# Patient Record
Sex: Male | Born: 1975 | Race: White | Marital: Married | Smoking: Never smoker
Health system: Southern US, Community
[De-identification: ages and names within clinical notes are randomized; demographics above are authoritative.]

## PROBLEM LIST (undated history)

## (undated) DIAGNOSIS — G4730 Sleep apnea, unspecified: Secondary | ICD-10-CM

## (undated) DIAGNOSIS — F329 Major depressive disorder, single episode, unspecified: Secondary | ICD-10-CM

## (undated) DIAGNOSIS — F32A Depression, unspecified: Secondary | ICD-10-CM

## (undated) HISTORY — PX: NASAL SEPTUM SURGERY: SHX37

---

## 2016-02-10 ENCOUNTER — Ambulatory Visit: Payer: Self-pay | Admitting: Physician Assistant

## 2016-09-09 ENCOUNTER — Encounter: Payer: Self-pay | Admitting: Emergency Medicine

## 2016-09-09 ENCOUNTER — Emergency Department: Payer: BLUE CROSS/BLUE SHIELD

## 2016-09-09 ENCOUNTER — Emergency Department
Admission: EM | Admit: 2016-09-09 | Discharge: 2016-09-09 | Disposition: A | Discharge status: Home / Self Care | Payer: BLUE CROSS/BLUE SHIELD | Attending: Student in an Organized Health Care Education/Training Program | Admitting: Student in an Organized Health Care Education/Training Program

## 2016-09-09 DIAGNOSIS — X58XXXA Exposure to other specified factors, initial encounter: Secondary | ICD-10-CM | POA: Insufficient documentation

## 2016-09-09 DIAGNOSIS — T18120A Food in esophagus causing compression of trachea, initial encounter: Secondary | ICD-10-CM | POA: Insufficient documentation

## 2016-09-09 DIAGNOSIS — J069 Acute upper respiratory infection, unspecified: Secondary | ICD-10-CM | POA: Insufficient documentation

## 2016-09-09 DIAGNOSIS — Y929 Unspecified place or not applicable: Secondary | ICD-10-CM | POA: Insufficient documentation

## 2016-09-09 DIAGNOSIS — R0989 Other specified symptoms and signs involving the circulatory and respiratory systems: Secondary | ICD-10-CM

## 2016-09-09 DIAGNOSIS — Y939 Activity, unspecified: Secondary | ICD-10-CM | POA: Insufficient documentation

## 2016-09-09 DIAGNOSIS — Y999 Unspecified external cause status: Secondary | ICD-10-CM | POA: Insufficient documentation

## 2016-09-09 HISTORY — DX: Major depressive disorder, single episode, unspecified: F32.9

## 2016-09-09 HISTORY — DX: Sleep apnea, unspecified: G47.30

## 2016-09-09 HISTORY — DX: Depression, unspecified: F32.A

## 2016-09-09 LAB — COMPREHENSIVE METABOLIC PANEL
ALT: 68 U/L — ABNORMAL HIGH (ref 17–63)
AST: 43 U/L — ABNORMAL HIGH (ref 15–41)
Albumin: 4.9 g/dL (ref 3.5–5.0)
Alkaline Phosphatase: 41 U/L (ref 38–126)
Anion gap: 8 (ref 5–15)
BILIRUBIN TOTAL: 0.5 mg/dL (ref 0.3–1.2)
BUN: 13 mg/dL (ref 6–20)
CO2: 27 mmol/L (ref 22–32)
Calcium: 9.4 mg/dL (ref 8.9–10.3)
Chloride: 102 mmol/L (ref 101–111)
Creatinine, Ser: 0.95 mg/dL (ref 0.61–1.24)
Glucose, Bld: 115 mg/dL — ABNORMAL HIGH (ref 65–99)
POTASSIUM: 4.3 mmol/L (ref 3.5–5.1)
Sodium: 137 mmol/L (ref 135–145)
TOTAL PROTEIN: 8.4 g/dL — AB (ref 6.5–8.1)

## 2016-09-09 LAB — CBC WITH DIFFERENTIAL/PLATELET
BASOS ABS: 0.1 10*3/uL (ref 0–0.1)
Basophils Relative: 1 %
EOS ABS: 0.1 10*3/uL (ref 0–0.7)
Eosinophils Relative: 1 %
HCT: 44.1 % (ref 40.0–52.0)
HEMOGLOBIN: 15.3 g/dL (ref 13.0–18.0)
LYMPHS ABS: 1.3 10*3/uL (ref 1.0–3.6)
LYMPHS PCT: 15 %
MCH: 31.4 pg (ref 26.0–34.0)
MCHC: 34.8 g/dL (ref 32.0–36.0)
MCV: 90.3 fL (ref 80.0–100.0)
Monocytes Absolute: 0.7 10*3/uL (ref 0.2–1.0)
Monocytes Relative: 8 %
NEUTROS PCT: 75 %
Neutro Abs: 6.9 10*3/uL — ABNORMAL HIGH (ref 1.4–6.5)
PLATELETS: 212 10*3/uL (ref 150–440)
RBC: 4.88 MIL/uL (ref 4.40–5.90)
RDW: 13 % (ref 11.5–14.5)
WBC: 9.1 10*3/uL (ref 3.8–10.6)

## 2016-09-09 MED ORDER — CETIRIZINE HCL 10 MG PO TABS: 10.0000 mg | ORAL_TABLET | Freq: Every day | ORAL | 0 refills | Status: AC

## 2016-09-09 MED ORDER — IOPAMIDOL (ISOVUE-300) INJECTION 61%
75.0000 mL | Freq: Once | INTRAVENOUS | Status: AC | PRN
  Administered 2016-09-09: 75 mL via INTRAVENOUS
  Filled 2016-09-09: qty 75

## 2016-09-09 MED ORDER — MAGIC MOUTHWASH W/LIDOCAINE: 5.0000 mL | Freq: Four times a day (QID) | ORAL | 0 refills | Status: AC

## 2016-09-09 MED ORDER — FLUTICASONE PROPIONATE 50 MCG/ACT NA SUSP: 1.0000 | Freq: Two times a day (BID) | NASAL | 0 refills | Status: AC

## 2016-09-09 NOTE — ED Triage Notes (Signed)
Pt feels like something is stuck in throat. Uvula appears swollen. "feels bad when pressure goes out, like coughing. Feels like something attached to me is touching somewhere else." feels strange when using back of throat. Happened when eating a lozenge and started coughing and feels like something is just stuck. Swallowing and breathing are WNL.

## 2016-09-09 NOTE — ED Provider Notes (Signed)
[facility] Emergency Department Provider Note  ____________________________________________  Time seen: Approximately 3:02 PM  I have reviewed the triage vital signs and the nursing notes.   HISTORY  Chief Complaint uvula swollen    HPI Juan Hartman is a 40 y.o. male who presents emergency department complaining of a foreign body sensation to his throat. Patient states that he had cold-like symptoms starting yesterday with some nasal congestion, mild cough and scratchy throat. Today, patient woke up and used a throat lozenge for symptoms. Patient reports that it became stuck in the oropharynx causing him to choke. Patient states that he was able clear the obstruction but continues to have a foreign body sensation. Patient reports no difficulty breathing or swallowing liquids. Patient still endorses some mild nasal congestion but denies any fevers or chills, difficulty breathing or swallowing, chest pain, shortness of breath, abdominal pain, nausea or vomiting. Patient has used vitamin C throat lozenges and cough syrup.   Past Medical History:  Diagnosis Date  . Depression   . Sleep apnea     There are no active problems to display for this patient.   Past Surgical History:  Procedure Laterality Date  . NASAL SEPTUM SURGERY      Prior to Admission medications   Medication Sig Start Date End Date Taking? Authorizing Provider  cetirizine (ZYRTEC) 10 MG tablet Take 1 tablet (10 mg total) by mouth daily. 09/09/16   Delorise RoyalsJonathan D Cuthriell, PA-C  fluticasone (FLONASE) 50 MCG/ACT nasal spray Place 1 spray into both nostrils 2 (two) times daily. 09/09/16   Delorise RoyalsJonathan D Cuthriell, PA-C  magic mouthwash w/lidocaine SOLN Take 5 mLs by mouth 4 (four) times daily. 09/09/16   Delorise RoyalsJonathan D Cuthriell, PA-C    Allergies Codeine  History reviewed. No pertinent family history.  Social History Social History  Substance Use Topics  . Smoking status: Never Smoker  .  Smokeless tobacco: Never Used  . Alcohol use No     Review of Systems  Constitutional: No fever/chills Eyes: No visual changes. No discharge ENT: Positive for nasal congestion and scratchy throat. Positive for choking episode with foreign body sensation. Cardiovascular: no chest pain. Respiratory: no cough. No SOB. Gastrointestinal: No abdominal pain.  No nausea, no vomiting.  Musculoskeletal: Negative for musculoskeletal pain. Skin: Negative for rash, abrasions, lacerations, ecchymosis. Neurological: Negative for headaches, focal weakness or numbness. 10-point ROS otherwise negative.  ____________________________________________   PHYSICAL EXAM:  VITAL SIGNS: ED Triage Vitals  Enc Vitals Group     BP 09/09/16 1429 131/78     Pulse Rate 09/09/16 1429 82     Resp 09/09/16 1429 18     Temp 09/09/16 1429 98.6 F (37 C)     Temp Source 09/09/16 1429 Oral     SpO2 09/09/16 1429 98 %     Weight 09/09/16 1431 235 lb (106.6 kg)     Height 09/09/16 1431 5\' 5"  (1.651 m)     Head Circumference --      Peak Flow --      Pain Score --      Pain Loc --      Pain Edu? --      Excl. in GC? --      Constitutional: Alert and oriented. Well appearing and in no acute distress. Eyes: Conjunctivae are normal. PERRL. EOMI. Head: Atraumatic. ENT:      Ears:       Nose: Mild clear congestion/rhinnorhea.      Mouth/Throat: Mucous membranes are  moist. Mallampati score 1, positive for slightly enlarged linear tonsils. Uvula is erythematous but nonedematous. Uvula is midline. Tonsils are mildly erythematous but not edematous. No exudates. No foreign body visualized. Neck: No stridor.   Hematological/Lymphatic/Immunilogical: No cervical lymphadenopathy. Cardiovascular: Normal rate, regular rhythm. Normal S1 and S2.  Good peripheral circulation. Respiratory: Normal respiratory effort without tachypnea or retractions. Lungs CTAB. Good air entry to the bases with no decreased or absent breath  sounds. Gastrointestinal: Bowel sounds 4 quadrants. Soft and nontender to palpation. No guarding or rigidity. No palpable masses. No distention.  Musculoskeletal: Full range of motion to all extremities. No gross deformities appreciated. Neurologic:  Normal speech and language. No gross focal neurologic deficits are appreciated.  Skin:  Skin is warm, dry and intact. No rash noted. Psychiatric: Mood and affect are normal. Speech and behavior are normal. Patient exhibits appropriate insight and judgement.   ____________________________________________   LABS (all labs ordered are listed, but only abnormal results are displayed)  Labs Reviewed  COMPREHENSIVE METABOLIC PANEL - Abnormal; Notable for the following:       Result Value   Glucose, Bld 115 (*)    Total Protein 8.4 (*)    AST 43 (*)    ALT 68 (*)    All other components within normal limits  CBC WITH DIFFERENTIAL/PLATELET - Abnormal; Notable for the following:    Neutro Abs 6.9 (*)    All other components within normal limits   ____________________________________________  EKG   ____________________________________________  RADIOLOGY Festus BarrenI, Jonathan D Cuthriell, personally viewed and evaluated these images (plain radiographs) as part of my medical decision making, as well as reviewing the written report by the radiologist  Dg Neck Soft Tissue  Result Date: 09/09/2016 CLINICAL DATA:  Swallowed a throat lozenge and feels it is stuck in the mid throat. EXAM: NECK SOFT TISSUES - 1+ VIEW COMPARISON:  None. FINDINGS: An oval lucency is identified at the base of the vallecular. Whether this is air below a band of secretions or a radiolucent foreign body such as a lozenge is unclear. No other abnormalities. IMPRESSION: Oval radial lucency at the base of the vallecular. Whether this is air inferior to a band of secretions or a radiolucent foreign body such as a lozenge is unclear. Electronically Signed   By: Gerome Samavid  Williams III M.D    On: 09/09/2016 15:23   Ct Soft Tissue Neck W Contrast  Result Date: 09/09/2016 CLINICAL DATA:  Possible foreign body. Choked on a lozenge. Persistent foreign body sensation. EXAM: CT NECK WITH CONTRAST TECHNIQUE: Multidetector CT imaging of the neck was performed using the standard protocol following the bolus administration of intravenous contrast. CONTRAST:  75mL ISOVUE-300 IOPAMIDOL (ISOVUE-300) INJECTION 61% COMPARISON:  Soft tissue neck radiographs earlier today FINDINGS: Pharynx and larynx: Mild symmetric prominence of the palatine tonsils and posterior nasopharyngeal soft tissues without focal mass. The larynx is unremarkable. No foreign body or parapharyngeal inflammation is seen. Salivary glands: No inflammation, mass, or stone. Thyroid: Unremarkable. Lymph nodes: Scattered small lymph nodes bilaterally in the neck with the largest measuring 10 mm in short axis in level II on the left, likely benign/ reactive. Vascular: Major vascular structures of the neck appear patent. Limited intracranial: Unremarkable. Visualized orbits: Unremarkable. Mastoids and visualized paranasal sinuses: Clear. Skeleton: Mild spondylosis at C5-6. Upper chest: Unremarkable. Other: None. IMPRESSION: No evidence of foreign body or other acute abnormality in the neck. Electronically Signed   By: Sebastian AcheAllen  Grady M.D.   On: 09/09/2016 17:15  ____________________________________________    PROCEDURES  Procedure(s) performed:    Procedures    Medications  iopamidol (ISOVUE-300) 61 % injection 75 mL (75 mLs Intravenous Contrast Given 09/09/16 1656)     ____________________________________________   INITIAL IMPRESSION / ASSESSMENT AND PLAN / ED COURSE  Pertinent labs & imaging results that were available during my care of the patient were reviewed by me and considered in my medical decision making (see chart for details).  Review of the [place] CSRS was performed in accordance of the [facility] prior to dispensing any  controlled drugs.  Clinical Course as of Sep 09 1738  Wynelle Link Sep 09, 2016  1554 Patient presented to the emergency department complaining of foreign body sensation to the throat status post choking on a throat lozenge or earlier today. Patient continued to have a foreign body sensation even though he is able to drink appropriately and no respiratory difficulties. No stridor on exam. Exam is reassuring. Patient does have some mild URI symptoms accompanying this. Initially, patient was evaluated with a soft tissue neck x-ray. This returns with possible foreign body versus air. Due to patient's continual complaint of foreign body sensation, it is felt best to evaluate patient with CT scan at this time. Pending results, if this does return with foreign body, ENT surgeon will be consulted.  [JC]    Clinical Course User Index [JC] Delorise Royals Cuthriell, PA-C    Patient's diagnosis is consistent with Viral URI, choking. Patient choked on a piece of hard lozenge earlier today. While exam is reassuring, patient did have a foreign body sensation at this time. X-ray returned with possible foreign body/air bubble. Due to the continued foreign body sensation, it was felt that this would better be evaluated by CT scan. Patient's labs and imaging as far as CT scan returned with reassuring results with no indication of foreign body. At this time, patient will be given symptomatic medications to help with scratchy, sore throat and foreign body sensation as well as some of the his URI complaints. Patient will follow up with primary care as needed..  Patient is given ED precautions to return to the ED for any worsening or new symptoms.     ____________________________________________  FINAL CLINICAL IMPRESSION(S) / ED DIAGNOSES  Final diagnoses:  Choking in adult  Viral upper respiratory tract infection      NEW MEDICATIONS STARTED DURING THIS VISIT:  New Prescriptions   CETIRIZINE (ZYRTEC) 10 MG TABLET     Take 1 tablet (10 mg total) by mouth daily.   FLUTICASONE (FLONASE) 50 MCG/ACT NASAL SPRAY    Place 1 spray into both nostrils 2 (two) times daily.   MAGIC MOUTHWASH W/LIDOCAINE SOLN    Take 5 mLs by mouth 4 (four) times daily.        This chart was dictated using voice recognition software/Dragon. Despite best efforts to proofread, errors can occur which can change the meaning. Any change was purely unintentional.    Racheal Patches, PA-C 09/09/16 1739    Willy Eddy, MD 09/09/16 2326

## 2017-10-18 IMAGING — CT CT NECK W/ CM
3 of 5 series · 12 of 33 positions shown, 14 images · IV contrast (iopamidol)
Comparison: Soft tissue neck radiographs earlier today

CLINICAL DATA: Possible foreign body. Choked on a lozenge.
Persistent foreign body sensation.

EXAM:
CT NECK WITH CONTRAST
TECHNIQUE: Multidetector CT imaging of the neck was performed using the
standard protocol following the bolus administration of intravenous
contrast.
CONTRAST:  75mL STVHNH-Q33 IOPAMIDOL (STVHNH-Q33) INJECTION 61%

[Series 6: sag neck · sagittal · 0.43mm/px · 5 of 82 slices shown, 6 images]
[im 28/82  bone]
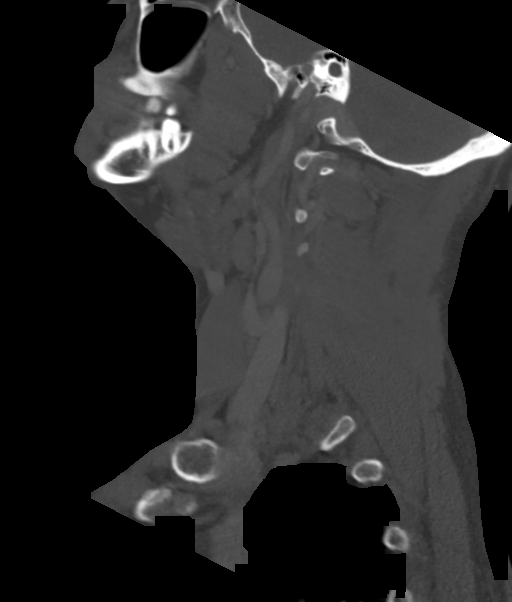
[im 34/82  bone]
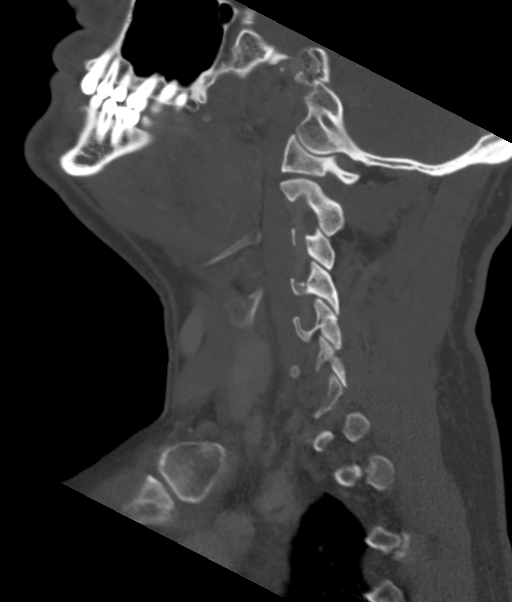
[im 41/82  soft-tissue]
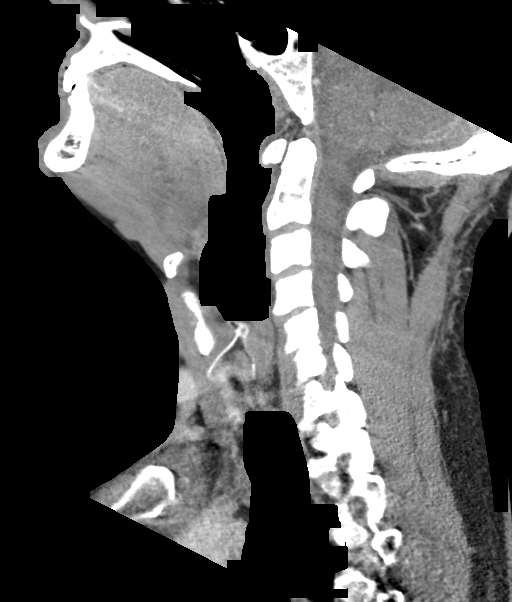
[im 41/82  bone]
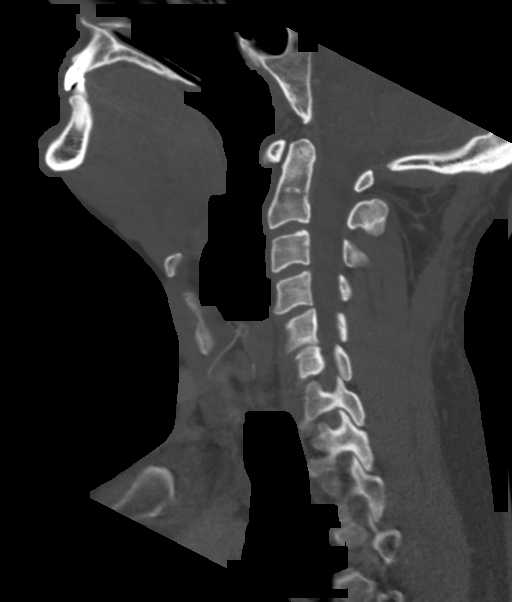
[im 48/82  bone]
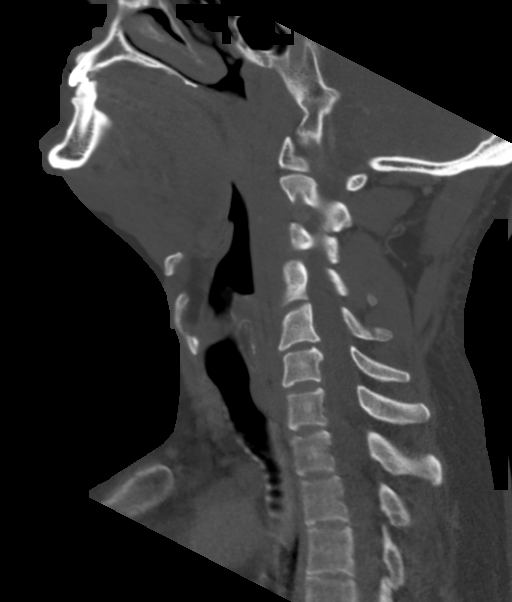
[im 55/82  bone]
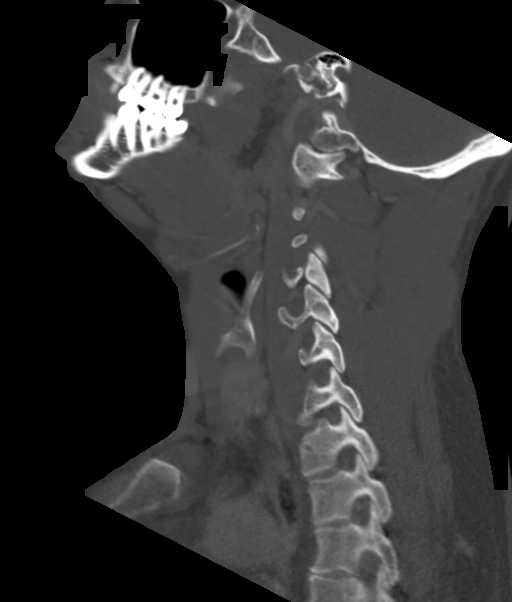

[Series 7: cor neck · coronal · 0.38mm/px · 3 of 101 slices shown]
[im 36/101  bone]
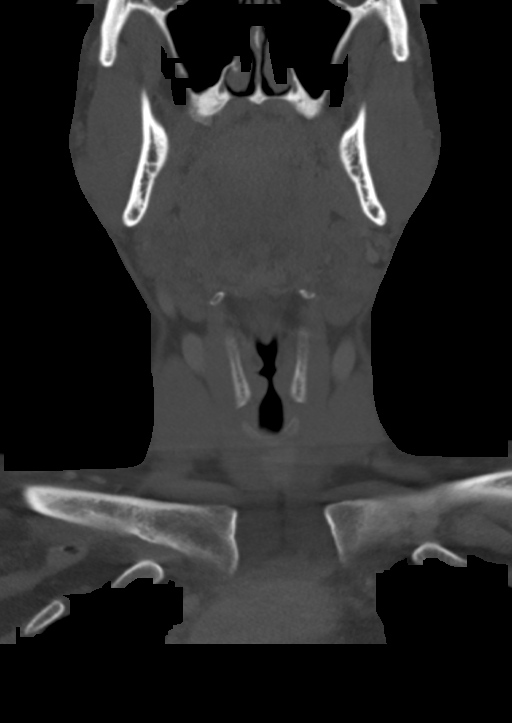
[im 46/101  bone]
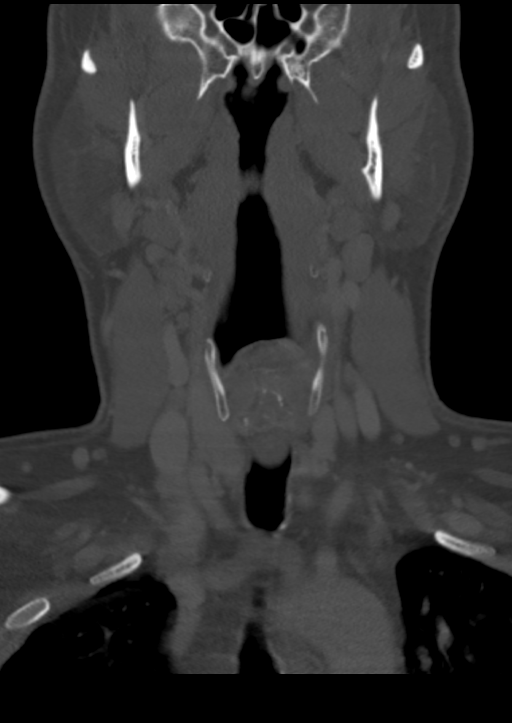
[im 56/101  bone]
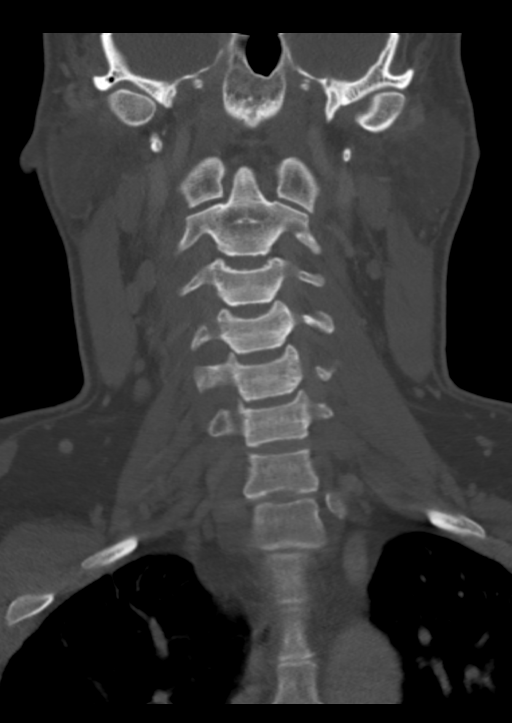

[Series 8: orthogonal ax · axial · 0.35mm/px · z∈[-313,-149]mm · 4 of 140 slices shown, 5 images]
[im 24/140  soft-tissue]
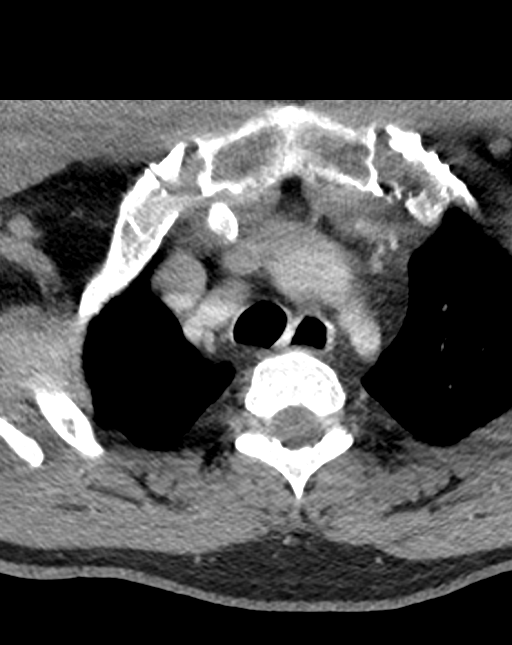
[im 24/140  bone]
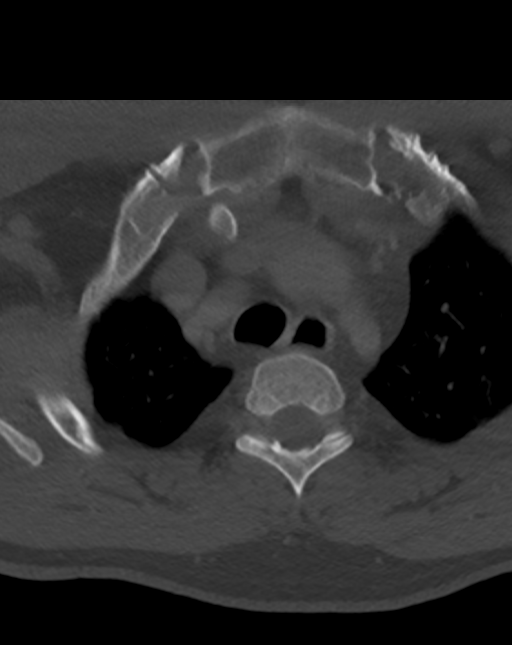
[im 47/140  bone]
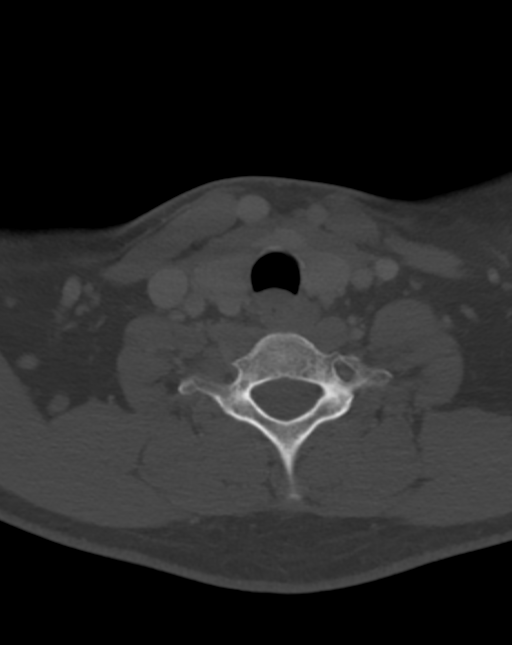
[im 93/140  bone]
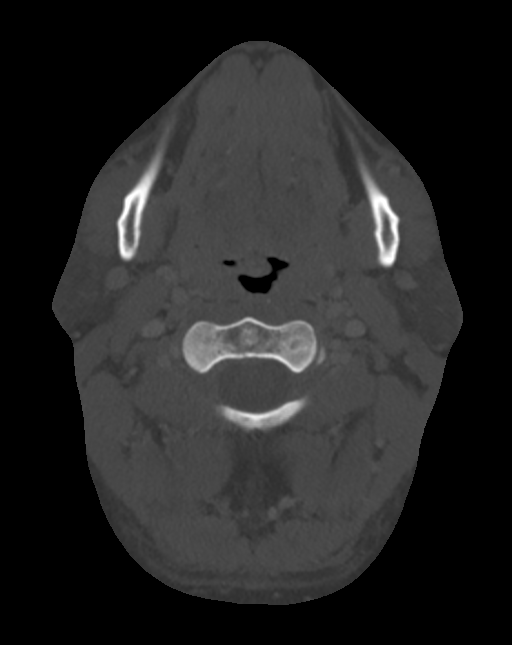
[im 116/140  bone]
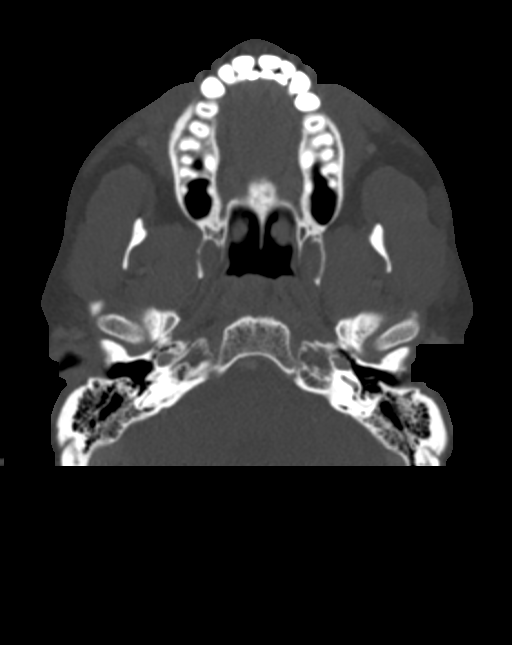

[12 of 33 positions shown; findings below may reference images not displayed]

FINDINGS: Pharynx and larynx: Mild symmetric prominence of the palatine
tonsils and posterior nasopharyngeal soft tissues without focal
mass. The larynx is unremarkable. No foreign body or parapharyngeal
inflammation is seen.

Salivary glands: No inflammation, mass, or stone.

Thyroid: Unremarkable.

Lymph nodes: Scattered small lymph nodes bilaterally in the neck
with the largest measuring 10 mm in short axis in level II on the
left, likely benign/ reactive.

Vascular: Major vascular structures of the neck appear patent.

Limited intracranial: Unremarkable.

Visualized orbits: Unremarkable.

Mastoids and visualized paranasal sinuses: Clear.

Skeleton: Mild spondylosis at C5-6.

Upper chest: Unremarkable.

Other: None.
IMPRESSION: No evidence of foreign body or other acute abnormality in the neck.
